# Patient Record
Sex: Female | Born: 1965 | Race: White | Hispanic: No | Marital: Married | Smoking: Current every day smoker
Health system: Southern US, Community
[De-identification: ages and names within clinical notes are randomized; demographics above are authoritative.]

## PROBLEM LIST (undated history)

## (undated) DIAGNOSIS — K219 Gastro-esophageal reflux disease without esophagitis: Secondary | ICD-10-CM

## (undated) DIAGNOSIS — N200 Calculus of kidney: Secondary | ICD-10-CM

## (undated) DIAGNOSIS — K2970 Gastritis, unspecified, without bleeding: Secondary | ICD-10-CM

## (undated) DIAGNOSIS — K5010 Crohn's disease of large intestine without complications: Secondary | ICD-10-CM

## (undated) DIAGNOSIS — M545 Low back pain, unspecified: Secondary | ICD-10-CM

## (undated) DIAGNOSIS — Z800 Family history of malignant neoplasm of digestive organs: Secondary | ICD-10-CM

## (undated) DIAGNOSIS — D649 Anemia, unspecified: Secondary | ICD-10-CM

## (undated) DIAGNOSIS — N83209 Unspecified ovarian cyst, unspecified side: Secondary | ICD-10-CM

## (undated) DIAGNOSIS — F419 Anxiety disorder, unspecified: Secondary | ICD-10-CM

## (undated) DIAGNOSIS — K222 Esophageal obstruction: Secondary | ICD-10-CM

## (undated) DIAGNOSIS — K449 Diaphragmatic hernia without obstruction or gangrene: Secondary | ICD-10-CM

## (undated) DIAGNOSIS — C179 Malignant neoplasm of small intestine, unspecified: Secondary | ICD-10-CM

## (undated) DIAGNOSIS — K56600 Partial intestinal obstruction, unspecified as to cause: Secondary | ICD-10-CM

## (undated) HISTORY — DX: Diaphragmatic hernia without obstruction or gangrene: K44.9

## (undated) HISTORY — DX: Unspecified ovarian cyst, unspecified side: N83.209

## (undated) HISTORY — DX: Esophageal obstruction: K22.2

## (undated) HISTORY — PX: NOVASURE ABLATION: SHX5394

## (undated) HISTORY — DX: Crohn's disease of large intestine without complications: K50.10

## (undated) HISTORY — DX: Malignant neoplasm of small intestine, unspecified: C17.9

## (undated) HISTORY — DX: Family history of malignant neoplasm of digestive organs: Z80.0

## (undated) HISTORY — PX: TUBAL LIGATION: SHX77

## (undated) HISTORY — DX: Anxiety disorder, unspecified: F41.9

## (undated) HISTORY — DX: Gastro-esophageal reflux disease without esophagitis: K21.9

## (undated) HISTORY — DX: Anemia, unspecified: D64.9

## (undated) HISTORY — DX: Low back pain, unspecified: M54.50

## (undated) HISTORY — DX: Partial intestinal obstruction, unspecified as to cause: K56.600

## (undated) HISTORY — DX: Calculus of kidney: N20.0

## (undated) HISTORY — PX: LAPAROSCOPIC SMALL BOWEL RESECTION: SUR793

## (undated) HISTORY — PX: LITHOTRIPSY: SUR834

## (undated) HISTORY — DX: Gastritis, unspecified, without bleeding: K29.70

## (undated) HISTORY — DX: Low back pain: M54.5

---

## 2007-10-12 ENCOUNTER — Ambulatory Visit: Payer: Self-pay | Admitting: Gastroenterology

## 2007-10-19 ENCOUNTER — Ambulatory Visit (HOSPITAL_COMMUNITY): Admission: RE | Admit: 2007-10-19 | Discharge: 2007-10-19 | Payer: Self-pay | Admitting: Gastroenterology

## 2007-11-01 ENCOUNTER — Ambulatory Visit (HOSPITAL_COMMUNITY): Admission: RE | Admit: 2007-11-01 | Discharge: 2007-11-01 | Payer: Self-pay | Admitting: Gastroenterology

## 2007-11-05 ENCOUNTER — Emergency Department (HOSPITAL_COMMUNITY): Admission: EM | Admit: 2007-11-05 | Discharge: 2007-11-05 | Payer: Self-pay | Admitting: Emergency Medicine

## 2007-11-05 ENCOUNTER — Ambulatory Visit: Payer: Self-pay | Admitting: Gastroenterology

## 2007-12-01 DIAGNOSIS — C179 Malignant neoplasm of small intestine, unspecified: Secondary | ICD-10-CM

## 2007-12-01 HISTORY — DX: Malignant neoplasm of small intestine, unspecified: C17.9

## 2007-12-01 HISTORY — PX: HEMICOLECTOMY: SHX854

## 2008-03-17 ENCOUNTER — Encounter (INDEPENDENT_AMBULATORY_CARE_PROVIDER_SITE_OTHER): Payer: Self-pay | Admitting: Interventional Radiology

## 2008-03-17 ENCOUNTER — Ambulatory Visit (HOSPITAL_COMMUNITY): Admission: RE | Admit: 2008-03-17 | Discharge: 2008-03-17 | Payer: Self-pay | Admitting: General Surgery

## 2010-02-06 IMAGING — CT CT ABDOMEN W/ CM
1 of 3 series · 13 of 32 positions shown, 18 images · IV contrast (Omnipaque 300)
Comparison: Prior exams were performed at [facility] [HOSPITAL], not available for comparison.

CT ABDOMEN

CLINICAL DATA: Right lower quadrant pain, history of ileal
stricture and abscess

CT ABDOMEN AND PELVIS WITH CONTRAST
TECHNIQUE: Multidetector CT imaging of the abdomen and pelvis was
performed using the standard protocol following bolus
administration of intravenous contrast.
Contrast: Dilute oral contrast and 100 ml Nmnipaque-7TT

[Series 2: abd_pel 5.0 b40f · axial · 0.54mm/px · z∈[-570,-200]mm · 13 of 84 slices shown, 18 images]
[im 5/84  soft-tissue]
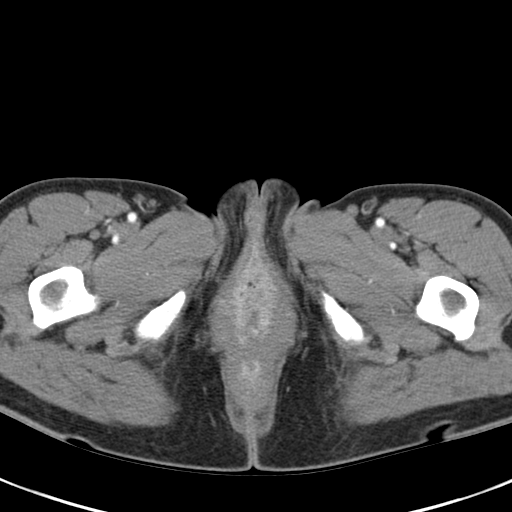
[im 5/84  bone]
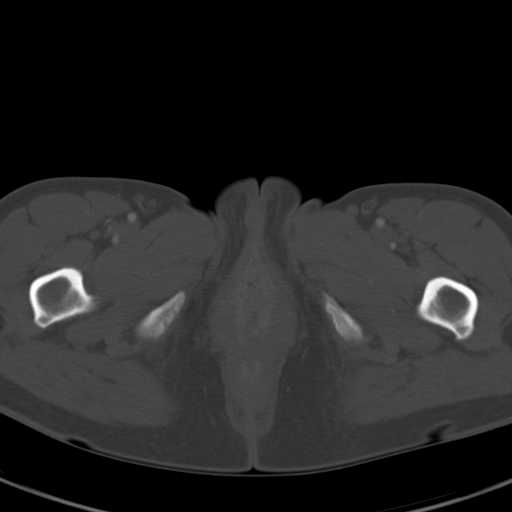
[im 15/84  soft-tissue]
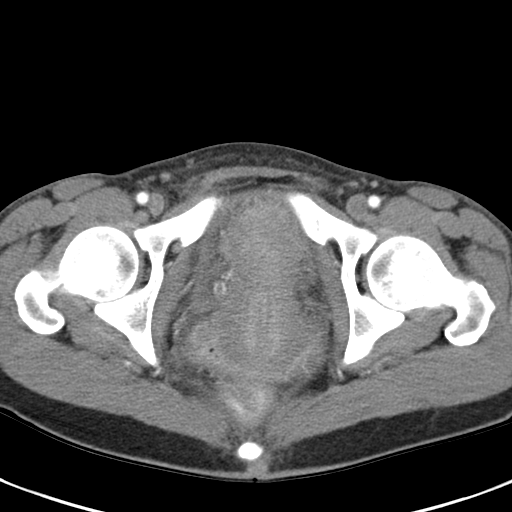
[im 20/84  soft-tissue]
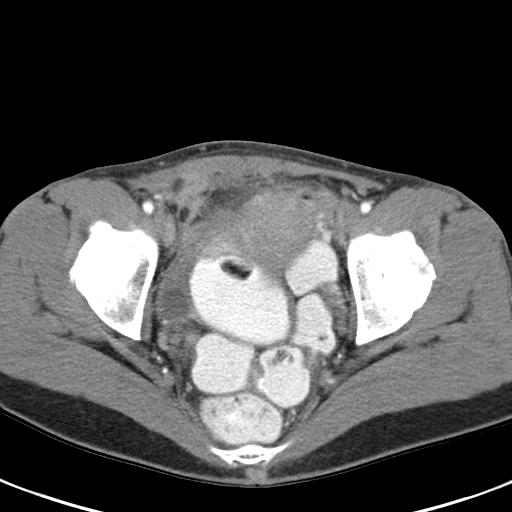
[im 25/84  soft-tissue]
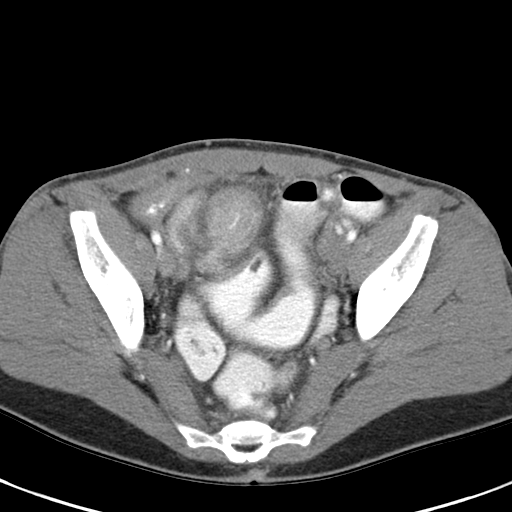
[im 35/84  soft-tissue]
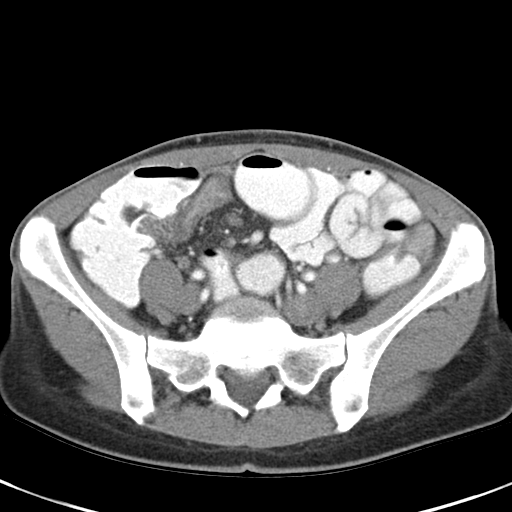
[im 40/84  soft-tissue]
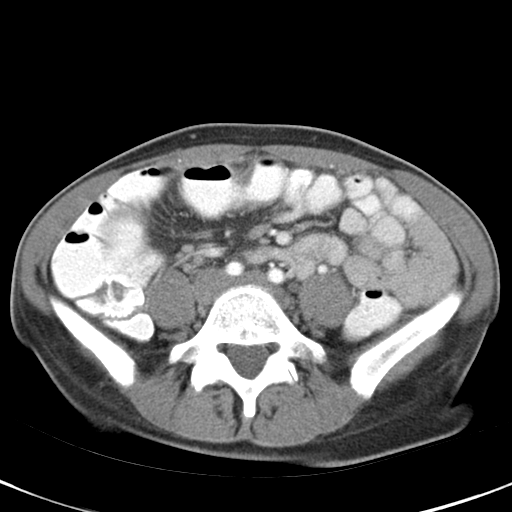
[im 44/84  soft-tissue]
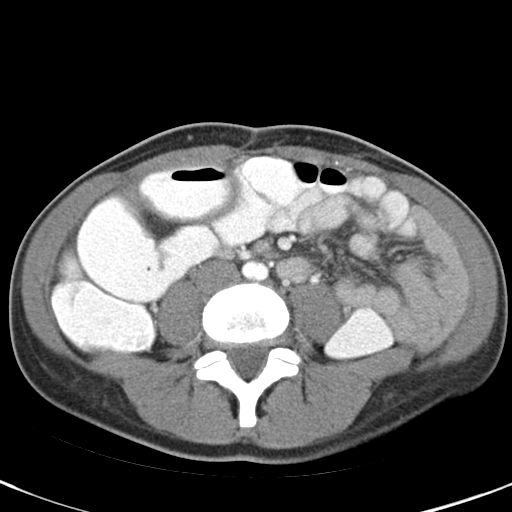
[im 54/84  soft-tissue]
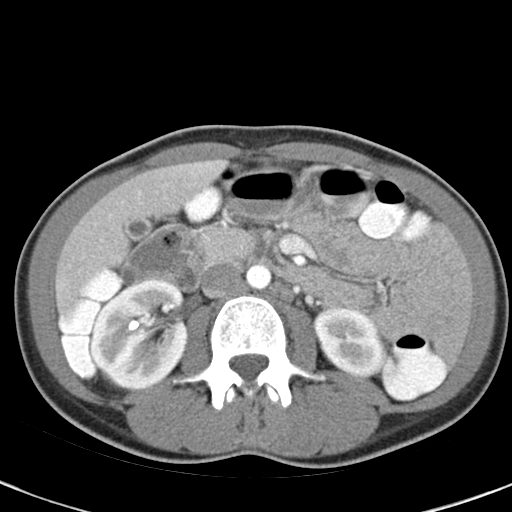
[im 59/84  soft-tissue]
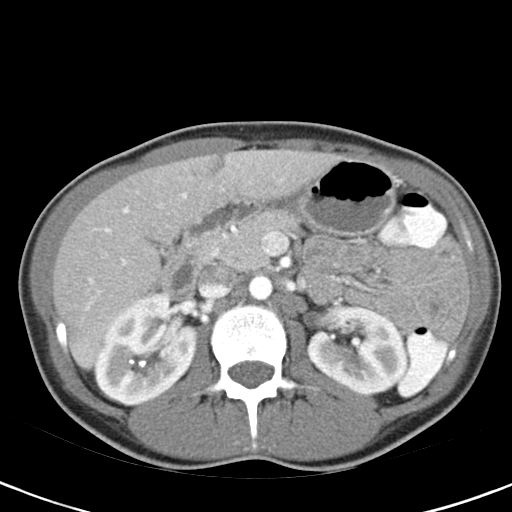
[im 59/84  bone]
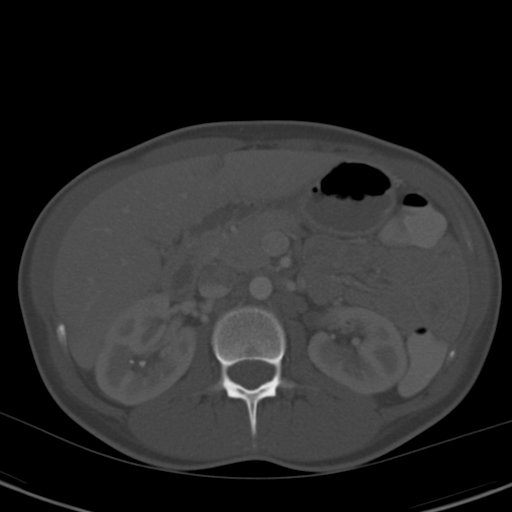
[im 64/84  soft-tissue]
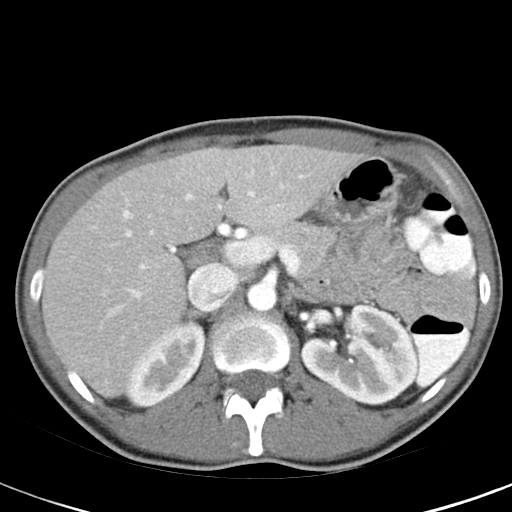
[im 64/84  lung]
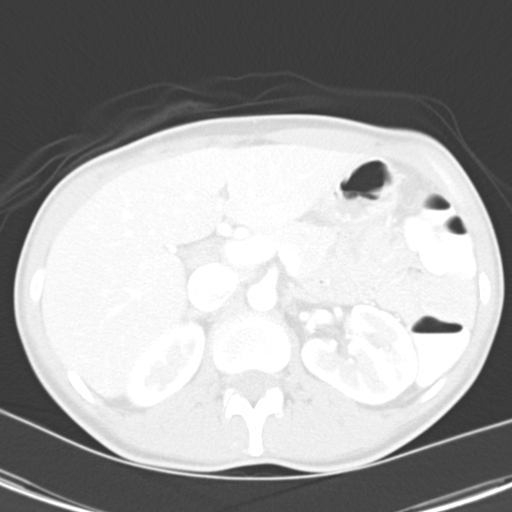
[im 69/84  lung]
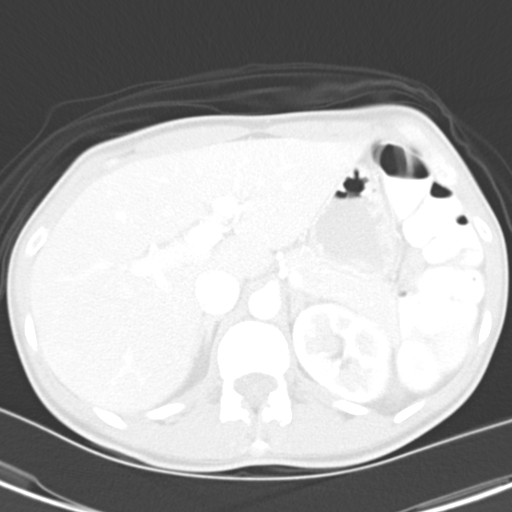
[im 74/84  soft-tissue]
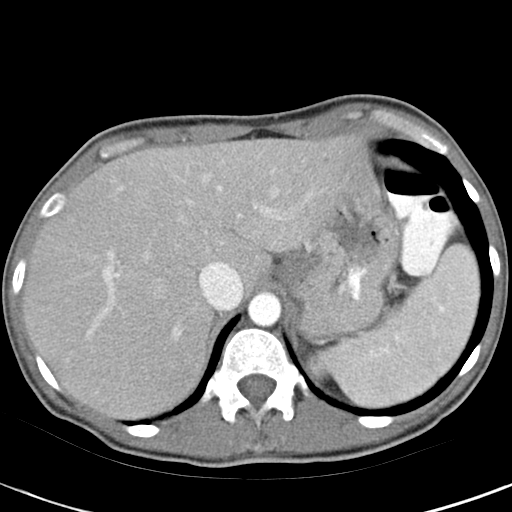
[im 74/84  lung]
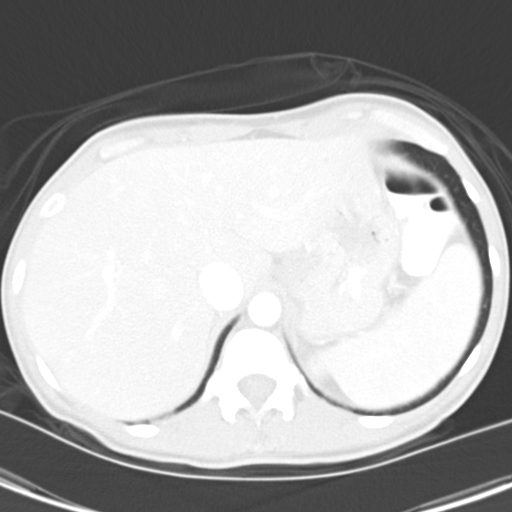
[im 79/84  soft-tissue]
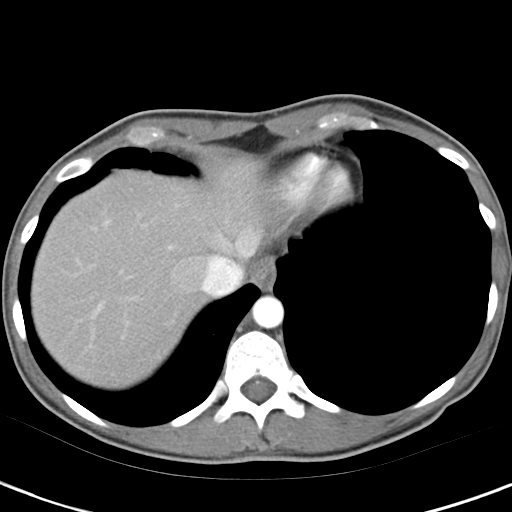
[im 79/84  lung]
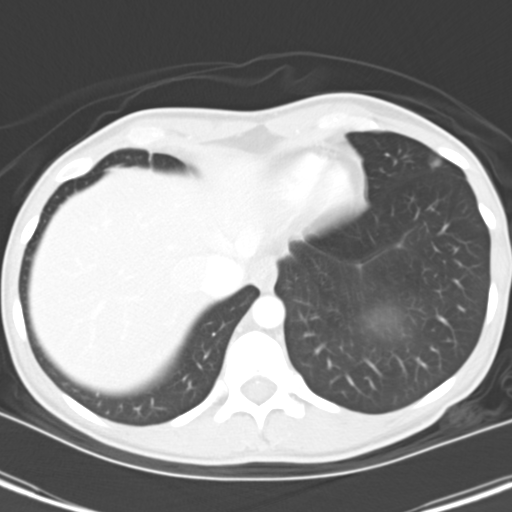

[13 of 32 positions shown; findings below may reference images not displayed]

FINDINGS: Minimal linear atelectasis or scarring, right middle lobe base.
Tiny nonspecific low attenuation foci in liver and kidneys, too
small to characterize.
Bilateral nonobstructing renal calculi.
Liver, spleen, pancreas, kidneys, and adrenal glands otherwise
normal.
No upper abdominal mass, adenopathy, free fluid, or inflammatory
process.
Oral contrast present in descending colon.
IMPRESSION: Bilateral nonobstructing renal calculi.
Tiny nonspecific low attenuation foci in liver and kidneys, too
small to characterize.
No acute upper abdominal abnormalities.

CT PELVIS
FINDINGS: Significant edema and bowel wall thickening of terminal ileum with
evidence of partial obstruction, with proximal small bowel loops
mildly dilated.
Thickened segment of distal ileum extends to ileocecal valve with
associated cecal wall thickening.
Surrounding inflammatory/edematous changes at thickened ileal loop
with mesenteric edema.
Minimal free pelvic fluid.
Small right ovarian cyst, 2.3 x 1.9 cm image 63.
No additional pelvic mass, adenopathy, or hernia.
Adjacent to the terminal ileum and cecum is an inflammatory
collection with central low attenuation suggesting abscess, area
measuring 2.8 x 1.7 cm image 63.
This collection contains a small high attenuation focus, unable to
exclude communication with terminal ileum.
An additional extraluminal tubular collection of slightly increased
attenuation identified near cecal tip, question contrast within a
dilated appendix, wall of this structure slightly thickened though
it is uncertain if this is a primary or secondary change due to
adjacent inflammatory process sees.
A separate normal appearing appendix is not localized.
No acute bony abnormalities.
IMPRESSION: Significant terminal ileitis with extensive bowel wall thickening
and surrounding edema.
Small abscess collection right pelvis [DATE] x 1.7 cm, cannot exclude
communication with GI tract due to central high attenuation.
Cecal and appendiceal wall thickening, uncertain if related to
terminal ileitis, concommitant colitis with a appendiceal wall
thickening, or appendicitis.
Proximal small bowel dilatation compatible with a mild partial
small bowel obstruction.
Correlation with prior outside exams would be of benefit to assess
stability of these findings.

## 2010-05-02 DIAGNOSIS — K5010 Crohn's disease of large intestine without complications: Secondary | ICD-10-CM

## 2010-05-02 HISTORY — DX: Crohn's disease of large intestine without complications: K50.10

## 2010-09-14 NOTE — Consult Note (Signed)
[name], Gill              ACCOUNT NO.:  [id number]   MEDICAL RECORD NO.:  [id number]          PATIENT TYPE:  EMS   LOCATION:  ED                            FACILITY:  [facility]   PHYSICIAN:  Kassie Mends, M.D.      DATE OF BIRTH:  [birth year]   DATE OF CONSULTATION:  11/05/2007  DATE OF DISCHARGE:                                 CONSULTATION   PRIMARY CARE PHYSICIAN:  Dr. Sherril Croon.   REQUESTING PHYSICIAN:  Dr. Toma Deiters.   CONSULTANT:  Dr. Lovell Sheehan, Surgery.   HISTORY OF PRESENT ILLNESS:  Jodi Gill is a 45 year old Caucasian  female with history of ileitis.  She has been seen previously by myself  and Dr. Cira Servant on October 12, 2007.  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  She was started on Cipro 500 mg b.i.d. as well as Flagyl 500  mg t.i.d.  She has continued to complain of left-sided abdominal pain  which radiates from her left lower quadrant to her upper abdomen.  She  complains of nausea but denies any vomiting.  She has had some dry  heaves.  She has had some low grade fevers at home.  She had been taking  Percocet 5/325 mg p.r.n. for pain but usually once daily.  She tells me  she needs something stronger for her pain control.  She had a CAT scan  on November 01, 2007, which showed dilated small-bowel loops, partial small-  bowel obstruction, persistent inflammatory process in the anterior right  pelvis with thickened TI, thickened appendix with hypervascularity and  small abscess collection which has slightly decreased in size about  approximately 50% since last CT October 19, 2007.  She is having 1 to 2  foamy loose stools per day.   PAST MEDICAL AND SURGICAL HISTORY:  Ileitis as described in HPI with  abscess collection as described in HPI.  She has a history of stricture  and partial small-bowel obstruction.  She has a history of anemia,  tubal  ligation and renal lithiasis.   CURRENT MEDICATIONS.:  1. Percocet 5/325 mg t.i.d. p.r.n.  2. Cipro 500 mg b.i.d.  3. Flagyl 500 mg t.i.d.   ALLERGIES:  TO MORPHINE.   FAMILY HISTORY:  Is positive for father diagnosed with colon cancer at  34.  Mother with history of anemia, hypertension, anxiety.  One brother  passed away at 25 due to MI, coronary artery disease.   SOCIAL HISTORY:  Jodi Gill is married.  She has one son who is grown  and healthy.  She is a spinner at a mill.  She has a 20-pack-year  history of tobacco use.  Previously consumed 6 to 8 beers per week but  has not been drinking recently.  She denies any drug use.   REVIEW OF SYSTEMS:  See HPI, otherwise negative.   PHYSICAL EXAMINATION:  VITAL SIGNS:  Temperature 97.3, blood pressure  110/68, pulse 91, respirations 20, pulse oximetry 94% on  room air.  GENERAL:  Jodi Gill is a thin-appearing, well-developed, well-  nourished, Caucasian female in no acute distress.  HEENT:  Sclerae are  clear and nonicteric.  Conjunctivae pink.  Oropharynx pink and moist  without lesions.  NECK:  Supple without mass or thyromegaly.  CHEST:  Heart regular rate and rhythm, normal S1/S2, without murmurs, rubs,  clicks or gallops.  LUNGS:  Clear to auscultation bilaterally.  ABDOMEN:  Positive bowel sounds x4.  No bruits auscultated.  Soft, nontender,  mildly distended.  She had does have mild tenderness to left lower  quadrant and around the umbilicus as well as right lower quadrant and  epigastrium on deep palpation.  There is no rebound tenderness or  guarding.  No hepatosplenomegaly or masses.  EXTREMITIES:  Without  clubbing or edema bilaterally.   LABORATORY STUDIES:  Are pending.   IMPRESSION:  Jodi Gill is a 45 year old Caucasian female with a history  of ileitis suspected be due to inflammatory bowel disease with partial  small-bowel obstruction and 50% resolution of previous abscess  collection in the right  anterior pelvis.  Her main concerns today is  pain control.  She has been using p.r.n. Percocet, and she is going to  need more adequate pain control and dietary supplementation.  She does  not make criteria for admission at this point.  She has been seen by Dr.  Lovell Sheehan surgery who did not recommend surgery at this point, given the  fact that she has partial small-bowel obstruction only.   PLAN:  1. Will add OxyContin 10 mg 1 p.o. b.i.d. as well as refill Percocet      5/325 mg 1 to 2 q.4-6h. p.r.n. for breakthrough pain #30 with no      refills.  She was given #60 OxyContin with no refills.  2. She has refills of Cipro 500 mg b.i.d. as well as Flagyl 500 mg      t.i.d. already which she will get from the pharmacy.  3. Low-residue diet with high calories  4. Repeat CT scan with oral contrast in 2 weeks.  5. She is going to call us if she has any further problems.  6. She was given a work note to keep her out from November 19, 2007.  7. Will follow up on lab work.  8. She will be given a dose of Zofran 4 mg IV now as well as Dilaudid      2 mg IV now and will be discharged home.  9. She has our number and will call if she has any further      complications.      Lorenza Burton, N.P.      Kassie Mends, M.D.  Electronically Signed    KJ/MEDQ  D:  11/05/2007  T:  11/05/2007  Job:  [id number]   cc:   Doreen Beam, MD  Fax: [phone]   Lovell Sheehan, M.D.  [place], [place]

## 2010-09-14 NOTE — Consult Note (Signed)
Jodi Gill, Jodi Gill              ACCOUNT NO.:  [id number]   MEDICAL RECORD NO.:  [id number]          PATIENT TYPE:  EMS   LOCATION:  ED                            FACILITY:  [facility]   PHYSICIAN:  Dalia Heading, M.D.  DATE OF BIRTH:  [birth year]   DATE OF CONSULTATION:  DATE OF DISCHARGE:  11/05/2007                                 CONSULTATION   REASON FOR CONSULTATION:  Terminal ileitis.   HISTORY OF PRESENT ILLNESS:  The patient is a 45 year old white female  who has had a long-standing history of intermittent right lower quadrant  abdominal pain secondary to an inflamed terminal ileum with a small  intra-abdominal abscess.  She has been to both [facility] and [facility].  She has had multiple CAT scans  showing an area of inflammation with microperforation in the right lower  quadrant region, the latest CT scan was done on November 01, 2007.  This  showed a smaller abscess, 70 mm in its greatest diameter, along with  terminal ileum inflammation.  She does have a partial small bowel  obstruction by CT scan.  She presented today due to worsening right  lower quadrant abdominal pain which Percocet did not help.  She denies  any fever or chills.  She denies any nausea or vomiting.  Her last bowel  movement was earlier today and was mucousy in nature.  She has not had a  colonoscopy due to recurrent flare-ups of this condition.   PAST MEDICAL HISTORY:  As noted above.   PAST SURGICAL HISTORY:  Unremarkable.   CURRENT MEDICATIONS:  As listed on the chart.   ALLERGIES:  MORPHINE.   REVIEW OF SYSTEMS:  Noncontributory.   On physical examination, the patient is a well-developed, well-nourished  white female in no acute distress.  She is afebrile.  Vital signs are  stable.  Her abdomen is soft and flat.  She does have tenderness of the  right lower quadrant region to palpation.  No hepatosplenomegaly,  masses, hernias, and rigidity are noted.   Laboratory  tests are still pending.  CT scan from November 01, 2007, was  reviewed.   IMPRESSION:  Resolving enteritis with resolving contained intra-  abdominal abscess, etiology unknown.   PLAN:  I will discuss with Dr. Cira Servant of Gastroenterology, who's office  has seen the patient in the past.  I do not feel she needs an acute  surgical intervention at this time.  I will follow the patient closely  with you.  This has been explained to the patient.  Due to the  possibility of Crohn disease, I would like to delay any surgical  intervention at all possible.  The patient understands this.      Dalia Heading, M.D.  Electronically Signed     MAJ/MEDQ  D:  11/05/2007  T:  11/06/2007  Job:  [id number]   cc:   Doreen Beam, MD  Fax: [phone]

## 2010-09-14 NOTE — Consult Note (Signed)
Jodi Gill, Jodi Gill              ACCOUNT NO.:  [id number]   MEDICAL RECORD NO.:  [id number]          PATIENT TYPE:  AMB   LOCATION:  DAY                           FACILITY:  [facility]   PHYSICIAN:  Kassie Mends, M.D.      DATE OF BIRTH:  1965-05-10   DATE OF CONSULTATION:  DATE OF DISCHARGE:                                 CONSULTATION   REQUESTING PHYSICIAN:  Dr. Sherril Croon.   REASON FOR CONSULTATION:  Transaminitis/history of ileitis.   HISTORY OF PRESENT ILLNESS:  Jodi Gill is a 45 year old Caucasian  female.  She has been referred for history of transaminitis.  She was  noted to have an elevated AST of 47 and ALT of 94 and GGT of 85 on August 16, 2007.  She had negative hepatitis A antibody, B surface antigen, and  C, and hepatitis B core antibody.  She notes prior to this issue she was  hospitalized in January as well as in May with acute ileitis.  Hospital  records have been reviewed and basically she tells me that she developed  severe abdominal pain mostly in the right lower quadrant along with some  nausea, vomiting, and low-grade fever.  She was having firmy bowel  movements.  She has noticed intermittent dark blood in her stool as  well.  She complains of right-sided stabbing abdominal pain that usually  lasts a few seconds, but persists for several days.  She had a CT scan  of the abdomen and pelvis without contrast on May 26, 2007.  Exam  was limited given lack of IV and oral contrast.  She was found to have  diffuse ileal wall thickening causing partial obstruction, bilateral  nephrolithiasis, and exam was repeated on May 28, 2007.  She was  found to have diffuse ileal thickening in the lower abdomen and upper  pelvis with evidence of partial SBO.  She had nonobstructing  nephrolithiasis and a 2.8-cm cyst within the right ovary.  She had a CT  enterography during second hospital admission on Sep 20, 2007.  She was  found to have markedly abnormal terminal 10 cm of the  ileum.  There is  no inner wall and areas of the transmural enhancement with an abrupt  tapering of the relatively normal-appearing but dilated ileum just  proximal to the inflamed segment.  Imaging features consistent with an  inflammatory stricture of the TI.  She has multiple microabscesses in  the phlegmonous change associated with the terminal ileum and a discrete  3.6 x 2.9 cm extraluminal abscess is seen in the anterior right lower  quadrant.  LFTs on Sep 20, 2007, show an alkaline phosphatase of 97, AST  of 29, and ALT of 48.  Total bilirubin is normal.  She had a low iron of  30, transferrin of 132, a TIBC of 195, and hemoglobin of 9.2 on Sep 23, 2007 with a hematocrit of 28 and an MCV of 86.6.  She had a white blood  cell count of 13.9 on Sep 20, 2007, and a platelet count of 677.  She  has been  treated with two courses of antibiotics for approximately 10  days a piece.  She notes that her nausea is much better.  She continues  to complain of daily indigestion and every time she eats she has  increased belching.  She has lost 13 pounds in the last 6 months.  She  had previously been on NSAID therapy, but is not using anything  currently.  She was previously on iron pills, but has not been taking  them recently.  She has cut out all of her caffeine and alcohol  consumption.  She does have three tattoos and has never received a blood  transfusion.  She did have an abdominal ultrasound August 24, 2007.  There is no evidence of gallstones or biliary ductal dilatation.  A 1-cm  hypoechoic lesion in the lateral segment of the left hepatic lobe  consistent with small hemangioma, recommend MRI or follow up with  ultrasound in 6 months to confirm.   PAST MEDICAL AND SURGICAL HISTORY:  Hospitalizations for acute ileitis  with possible stricturing, see HPI.  She has a history of anemia, tubal  ligation, and renal lithiasis.   CURRENT MEDICATIONS:  1. Multivitamin daily.  2. Iron pills  daily.   ALLERGIES:  MORPHINE causes a rash.   FAMILY HISTORY:  Positive for her father diagnosed with colon cancer at  age 75.  He passed away at age 39.  Mother has history of anemia,  hypertension, and anxiety.  One brother passed away at 55 due to MI.   SOCIAL HISTORY:  Jodi Gill is married.  She has one son who is grown  and healthy.  She is a spinner in a mill.  She has a 20-pack-year  history of tobacco use.  She previously consumed 6-8 beers per week, but  has not had anything to drink per report in the last 3 months.  She  denies any drug use.   REVIEW OF SYSTEMS:  See HPI, otherwise negative.   PHYSICAL EXAMINATION:  VITAL SIGNS:  Weight 106 pounds, height 62  inches, temperature 97.9, blood pressure 100/76, and pulse 88.  GENERAL:  Jodi Gill is a well-developed, well-nourished, thin Caucasian  female in no acute distress.  She is quite anxious today.  HEENT:  Sclerae clear and nonicteric.  Conjunctiva pink.  Oropharynx  pink and moist without any lesions.  NECK:  Supple without mass or thyromegaly.  HEART:  Regular rate and rhythm.  Normal S1 and S2 without murmurs,  clicks, rubs, or gallops.  ABDOMEN:  She does have an umbilical ornament.  Abdomen has positive  bowel sounds x4, soft, nontender, or nondistended.  She does have  moderate tenderness to the right mid abdomen just 1-2 cm adjacent to the  umbilicus.  There is no rebound tenderness or guarding.  No  hepatosplenomegaly or mass.  EXTREMITIES:  Without clubbing or edema bilaterally.   IMPRESSION:  Jodi Gill is a 45 year old female with a 16-month history  of persistent ileitis and possible small bowel stricture.  She has been  treated with two courses of antibiotics through [facility].  She is sent for further evaluation of transaminitis.  Her  transaminases have decreased significantly, but are not yet normal.   So basically as far as her ileitis is concerned, she is going to need  further  evaluation to rule out Crohn disease including biopsies and  given her history of indigestion, nausea and vomiting, and weight loss,  she will undergo EGD with  possible small bowel biopsy as well as  colonoscopy with terminal ileoscopy at the same time and possible biopsy  by Dr. Cira Servant.   As far as her transaminitis is concerned, liver appears normal with a 1-  cm possible hemangioma on recent ultrasound.  Nothing to explain her  transaminitis, although she has been on antibiotics which could cause a  transient transaminitis.  She was previously consuming daily alcohol  which could also be causing a mild transaminitis as well.  We do need to  rule out hepatitis C as well.   PLAN:  1. We would recheck LFTs as well as sed rate, CBC, hepatitis C      antibody, and MET-7 and we will draw two tubes of serum to freeze      for further testing if she has persistent moderate transaminitis.  2. EGD and colonoscopy with terminal ileoscopy by Dr. Cira Servant in the      near future.  We discussed both procedures including risks and      benefits to include, but not limited to bleeding, infection,      perforation, or drug reaction.  She agrees with plan and consent be      obtained.  3. Pending EGD and colonoscopy findings, we would consider either an      MRI of for further evaluation of hepatic lesion versus followup      ultrasound per Dr. Cira Servant.  4. She declines anything for pain at this time.   Thank you Dr. Sherril Croon for allowing [place] to participate in care of Jodi Gill.   ADDENDUM 7209:  CT Scan showed persistent inflammation in TI/Cecum,  likley IBD with abscess improved since May 2009. Pt continued on Abx.  Surgery consulted if abscess does not resolve with Abx then consider  drainage and/or surgery.      Lorenza Burton, N.P.      Kassie Mends, M.D.  Electronically Signed    KJ/MEDQ  D:  10/12/2007  T:  10/13/2007  Job:  [id number]   cc:   Doreen Beam, MD  Fax: [phone]

## 2011-01-24 ENCOUNTER — Encounter: Payer: Self-pay | Admitting: Gastroenterology

## 2011-01-27 LAB — DIFFERENTIAL
Basophils Absolute: 0.1
Basophils Relative: 1
Lymphocytes Relative: 15
Lymphs Abs: 1.4
Monocytes Relative: 4

## 2011-01-27 LAB — COMPREHENSIVE METABOLIC PANEL
AST: 18
Albumin: 3.3 — ABNORMAL LOW
Alkaline Phosphatase: 46
Chloride: 102
Total Bilirubin: 0.5

## 2011-01-27 LAB — CBC
HCT: 41.6
Hemoglobin: 13.7
MCHC: 32.9
Platelets: 445 — ABNORMAL HIGH
RBC: 4.87
RDW: 17.6 — ABNORMAL HIGH
WBC: 10

## 2011-01-27 LAB — URINALYSIS, ROUTINE W REFLEX MICROSCOPIC
Glucose, UA: NEGATIVE
Urobilinogen, UA: 0.2
pH: 6.5

## 2011-01-27 LAB — APTT: aPTT: 31

## 2011-01-27 LAB — PROTIME-INR: INR: 1

## 2011-02-01 LAB — ANAEROBIC CULTURE

## 2011-02-01 LAB — CBC
HCT: 38.8
Hemoglobin: 12.8
MCHC: 33
MCV: 85.7
RBC: 4.52
RDW: 17.6 — ABNORMAL HIGH
WBC: 8.4

## 2011-02-01 LAB — PROTIME-INR
INR: 1
Prothrombin Time: 13.9

## 2011-02-01 LAB — CULTURE, ROUTINE-ABSCESS

## 2011-02-01 LAB — APTT: aPTT: 30

## 2011-02-11 ENCOUNTER — Ambulatory Visit (INDEPENDENT_AMBULATORY_CARE_PROVIDER_SITE_OTHER): Payer: BC Managed Care – PPO | Admitting: Gastroenterology

## 2011-02-11 ENCOUNTER — Encounter: Payer: Self-pay | Admitting: Gastroenterology

## 2011-02-11 VITALS — BP 110/68 | HR 80 | Ht 62.0 in | Wt 106.8 lb

## 2011-02-11 DIAGNOSIS — F411 Generalized anxiety disorder: Secondary | ICD-10-CM

## 2011-02-11 DIAGNOSIS — K5080 Crohn's disease of both small and large intestine without complications: Secondary | ICD-10-CM

## 2011-02-11 DIAGNOSIS — F419 Anxiety disorder, unspecified: Secondary | ICD-10-CM

## 2011-02-11 DIAGNOSIS — K50811 Crohn's disease of both small and large intestine with rectal bleeding: Secondary | ICD-10-CM

## 2011-02-11 DIAGNOSIS — C172 Malignant neoplasm of ileum: Secondary | ICD-10-CM

## 2011-02-11 DIAGNOSIS — K625 Hemorrhage of anus and rectum: Secondary | ICD-10-CM

## 2011-02-11 MED ORDER — PEG-KCL-NACL-NASULF-NA ASC-C 100 G PO SOLR: 1.0000 | ORAL | Status: DC

## 2011-02-11 NOTE — Progress Notes (Signed)
History of Present Illness:  This is a very nice but very complex 45 year old Caucasian female. On physical exam she was recently found to have guaiac positive stool. The patient currently denies any GI symptoms such as melena, hematochezia, abdominal pain, upper GI or hepatobiliary complaints. She has regular daily bowel movements, follows a regular diet, has no food intolerances.  Her past medical history is very complex and involved around suspected inflammatory bowel disease in 2009 with associated ileitis. She was on immunosuppressive medication and apparently underwent a resection which showed evidence of adenocarcinoma. She subsequently developed an enteric cutaneous fistula, abscess, and had several surgical procedures. Currently she is asymptomatic and denies abdominal pain or any symptoms of IBD. Has not had colonoscopy followup since 2010. There are extensive records that I reviewed today from [place] and from [facility]. Recent blood work in September showed a normal CBC a metabolic profile and thyroid function tests.  I have reviewed this patient's present history, medical and surgical past history, allergies and medications.     ROS: The remainder of the 10 point ROS is negative... she does have allergic sinusitis, resume her menstrual cramps, and does complain of night sweats. She also positive for kidney stones and previous tubal ligation.  Past Medical History  Diagnosis Date  . Anxiety   . 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 Kidney stones   . Ovarian cyst   . Low back pain   . Bowel obstruction    Past Surgical History  Procedure Date  . Hemicolectomy 8-09    Rt  . Laparoscopic small bowel resection     reports that she has been smoking Cigarettes.  She has a 10 pack-year smoking history. She has never used smokeless tobacco. She reports that she drinks alcohol. She reports that she does not use illicit drugs. family history  includes Colon cancer in her paternal uncle; Colon cancer (age of onset:78) in her father; Hypertension in her mother; and Stomach cancer in her father. Allergies  Allergen Reactions  . Peanut Oil     Throat closes        Physical Exam: General well developed well nourished patient in no acute distress, appearing her stated age Eyes PERRLA, no icterus, fundoscopic exam per opthamologist Skin no lesions noted Neck supple, no adenopathy, no thyroid enlargement, no tenderness Chest clear to percussion and auscultation Heart no significant murmurs, gallops or rubs noted Abdomen no hepatosplenomegaly masses or tenderness, BS normal.  Extremities no acute joint lesions, edema, phlebitis or evidence of cellulitis. Neurologic patient oriented x 3, cranial nerves intact, no focal neurologic deficits noted. Psychological mental status normal and normal affect.  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 Her guaiac positive stool is probably related to this process. However, as mentioned above, she is completely asymptomatic. She is on B12 replacement therapy. Also the patient has chronic anxiety syndrome and exam next on when necessary basis. I scheduled her for colonoscopy and endoscopy with propofol sedation. I suspect the patient will also need followup CT scan of the abdomen and pelvis.  Please copy her primary care physician, referring physician, and pertinent subspecialists.   Encounter Diagnosis  Name Primary?  . Rectal bleeding Yes

## 2011-02-11 NOTE — Patient Instructions (Signed)
You have been scheduled for an Endoscopy and a Colonoscopy, instructions have been provided. Your prep has been sent to your pharmacy. 

## 2011-02-28 ENCOUNTER — Ambulatory Visit (AMBULATORY_SURGERY_CENTER): Payer: BC Managed Care – PPO | Admitting: Gastroenterology

## 2011-02-28 ENCOUNTER — Encounter: Payer: Self-pay | Admitting: Gastroenterology

## 2011-02-28 ENCOUNTER — Telehealth: Payer: Self-pay | Admitting: *Deleted

## 2011-02-28 VITALS — BP 107/66 | HR 83 | Temp 98.2°F | Resp 20 | Ht 62.0 in | Wt 106.0 lb

## 2011-02-28 DIAGNOSIS — K219 Gastro-esophageal reflux disease without esophagitis: Secondary | ICD-10-CM

## 2011-02-28 DIAGNOSIS — Z8719 Personal history of other diseases of the digestive system: Secondary | ICD-10-CM

## 2011-02-28 DIAGNOSIS — K625 Hemorrhage of anus and rectum: Secondary | ICD-10-CM

## 2011-02-28 DIAGNOSIS — K222 Esophageal obstruction: Secondary | ICD-10-CM

## 2011-02-28 DIAGNOSIS — K2970 Gastritis, unspecified, without bleeding: Secondary | ICD-10-CM

## 2011-02-28 DIAGNOSIS — K5289 Other specified noninfective gastroenteritis and colitis: Secondary | ICD-10-CM

## 2011-02-28 DIAGNOSIS — R195 Other fecal abnormalities: Secondary | ICD-10-CM

## 2011-02-28 DIAGNOSIS — K2990 Gastroduodenitis, unspecified, without bleeding: Secondary | ICD-10-CM

## 2011-02-28 DIAGNOSIS — C172 Malignant neoplasm of ileum: Secondary | ICD-10-CM

## 2011-02-28 DIAGNOSIS — K2950 Unspecified chronic gastritis without bleeding: Secondary | ICD-10-CM

## 2011-02-28 DIAGNOSIS — K2940 Chronic atrophic gastritis without bleeding: Secondary | ICD-10-CM

## 2011-02-28 DIAGNOSIS — K5010 Crohn's disease of large intestine without complications: Secondary | ICD-10-CM

## 2011-02-28 DIAGNOSIS — K50813 Crohn's disease of both small and large intestine with fistula: Secondary | ICD-10-CM

## 2011-02-28 MED ORDER — SODIUM CHLORIDE 0.9 % IV SOLN: 500.0000 mL | INTRAVENOUS | Status: DC

## 2011-02-28 MED ORDER — ESOMEPRAZOLE MAGNESIUM 40 MG PO CPDR: DELAYED_RELEASE_CAPSULE | ORAL | Status: AC

## 2011-02-28 NOTE — Patient Instructions (Addendum)
Please refer to your neon green sheet for instructions regarding activity for the rest of today. You may resume your medications as you would normally take them. Follow Up appointment in 2 weeks. CT Scan scheduled  Please follow the ESOPHAGEAL DILATION DIET for today as follows:  -Nothing by mouth for 1 hour 9:20AM-10:20AM  -Clear Liquids for 1 hour 10:20AM-11:20AM  -Soft foods for the rest of today 11:20AM until tomorrow  Colitis Colitis is inflammation of the colon. Colitis can be a short-term or long-standing (chronic) illness. Crohn's disease and ulcerative colitis are 2 types of colitis which are chronic. They usually require lifelong treatment. CAUSES  There are many different causes of colitis, including:  Viruses.   Germs (bacteria).   Medicine reactions.  SYMPTOMS   Diarrhea.   Intestinal bleeding.   Pain.   Fever.   Throwing up (vomiting).   Tiredness (fatigue).   Weight loss.   Bowel blockage.  DIAGNOSIS  The diagnosis of colitis is based on examination and stool or blood tests. X-rays, CT scan, and colonoscopy may also be needed. TREATMENT  Treatment may include:  Fluids given through the vein (intravenously).   Bowel rest (nothing to eat or drink for a period of time).   Medicine for pain and diarrhea.   Medicines (antibiotics) that kill germs.   Cortisone medicines.   Surgery.  HOME CARE INSTRUCTIONS   Get plenty of rest.   Drink enough water and fluids to keep your urine clear or pale yellow.   Eat a well-balanced diet.   Call your caregiver for follow-up as recommended.  SEEK IMMEDIATE MEDICAL CARE IF:   You develop chills.   You have an oral temperature above 102 F (38.9 C), not controlled by medicine.   You have extreme weakness, fainting, or dehydration.   You have repeated vomiting.   You develop severe belly (abdominal) pain or are passing bloody or tarry stools.  MAKE SURE YOU:   Understand these instructions.   Will  watch your condition.   Will get help right away if you are not doing well or get worse.  Document Released: 05/26/2004 Document Revised: 12/29/2010 Document Reviewed: 08/21/2009 [facility] Patient Information 2012 [place], [place]. Esophageal Stricture The esophagus is the long, narrow tube which carries food and liquid from the mouth to the stomach. Sometimes a part of the esophagus becomes narrow and makes it difficult, painful, or even impossible to swallow. This is called an esophageal stricture.  CAUSES  Common causes of blockage or strictures of the esophagus are:  Exposure of the lower esophagus to the acid from the stomach may cause narrowing.   Hiatal hernia in which a small part of the stomach bulges up through the diaphragm can cause a narrowing in the bottom of the esophagus.   Scleroderma is a tissue disorder that affects the esophagus and makes swallowing difficult.   Achalasia is an absence of nerves in the lower esophagus and to the esophageal sphincter. This absence of nerves may be congenital (present since birth). This can cause irregular spasms which do not allow food and fluid through.   Strictures may develop from swallowing materials which damage the esophagus. Examples are acids or alkalis such as lye.   Schatzki's Ring is a narrow ring of non-cancerous tissue which narrows the lower esophagus. The cause of this is unknown.   Growths can block the esophagus.  SYMPTOMS  Some of the problems are difficulty swallowing or pain with swallowing. DIAGNOSIS  Your caregiver often suspects  this problem by taking a medical history. They will also do a physical exam. They may then take X-rays and/or perform an endoscopy. Endoscopy is an exam in which a tube like a small flexible telescope is used to look at your esophagus.  TREATMENT  One form of treatment is to dilate the narrow area. This means to stretch it.   When this is not successful, chest surgery may be required.  This is a much more extensive form of treatment with a longer recovery time.  Both of the above treatments make the passage of food and water into the stomach easier. They also make it easier for stomach contents to bubble back into the esophagus. Special medications may be used following the procedure to help prevent further narrowing. Medications may be used to lower the amount of acid in the stomach juice.  SEEK IMMEDIATE MEDICAL CARE IF:   Your swallowing is becoming more painful, difficult, or you are unable to swallow.   You vomit up blood.   You develop black tarry stools.   You develop chills.   You have a fever.   You develop chest or abdominal pain.   You develop shortness of breath, feel lightheaded, or faint.  Follow up with medical care as your caregiver suggests. Document Released: 12/27/2005 Document Revised: 12/29/2010 Document Reviewed: 02/02/2006 [facility] Patient Information 2012 [place], [place].

## 2011-02-28 NOTE — Telephone Encounter (Signed)
Pt given her schedule for CT on 03/02/11 at 0900. She states she's allergic to the oral contrast no matter what flavor, so she was given the number to CT and informed she would have to be there 2 hours early to drink a water based contrast; stated understanding. She was also given a f/u appt with Dr Jarold Motto on 03/22/11 and informed her I ordered Nexium at [facility] in [place]; pt stated understanding.

## 2011-03-01 ENCOUNTER — Telehealth: Payer: Self-pay | Admitting: *Deleted

## 2011-03-01 ENCOUNTER — Encounter: Payer: Self-pay | Admitting: Gastroenterology

## 2011-03-01 LAB — HELICOBACTER PYLORI SCREEN-BIOPSY: UREASE: NEGATIVE

## 2011-03-01 NOTE — Telephone Encounter (Signed)
No answer

## 2011-03-02 ENCOUNTER — Telehealth: Payer: Self-pay | Admitting: *Deleted

## 2011-03-02 ENCOUNTER — Ambulatory Visit (INDEPENDENT_AMBULATORY_CARE_PROVIDER_SITE_OTHER)
Admission: RE | Admit: 2011-03-02 | Discharge: 2011-03-02 | Disposition: A | Discharge status: Home / Self Care | Payer: BC Managed Care – PPO | Source: Ambulatory Visit | Attending: Gastroenterology | Admitting: Gastroenterology

## 2011-03-02 DIAGNOSIS — Z8719 Personal history of other diseases of the digestive system: Secondary | ICD-10-CM

## 2011-03-02 DIAGNOSIS — K5090 Crohn's disease, unspecified, without complications: Secondary | ICD-10-CM

## 2011-03-02 DIAGNOSIS — K625 Hemorrhage of anus and rectum: Secondary | ICD-10-CM

## 2011-03-02 MED ORDER — IOHEXOL 300 MG/ML  SOLN
100.0000 mL | Freq: Once | INTRAMUSCULAR | Status: AC | PRN
  Administered 2011-03-02: 100 mL via INTRAVENOUS

## 2011-03-02 MED ORDER — BUDESONIDE 3 MG PO CP24: ORAL_CAPSULE | ORAL | Status: DC

## 2011-03-02 NOTE — Telephone Encounter (Signed)
Informed pt she needs to come in for labs- she will come tomorrow- and I need to order Entocort for her active Crohn's; pt stated understanding.

## 2011-03-02 NOTE — Telephone Encounter (Signed)
Message copied by Florene Glen on Wed Mar 02, 2011 12:56 PM ------      Message from: Jarold Motto, DAVID R      Created: Wed Mar 02, 2011 12:38 PM       These have her come in and do CRP and sed rate. Then start Entocort 9 mg a day with followup in one month's time. It appears that she has rather active Crohn's disease at her anastomosis.

## 2011-03-03 ENCOUNTER — Telehealth: Payer: Self-pay | Admitting: *Deleted

## 2011-03-03 ENCOUNTER — Other Ambulatory Visit: Payer: Self-pay | Admitting: Gastroenterology

## 2011-03-03 NOTE — Telephone Encounter (Signed)
Pt requested to have labs drawn at [facility]. Faxed order for CRP and ESR to [phone], ofc [phone]. Pt stated understanding.

## 2011-03-04 ENCOUNTER — Encounter: Payer: Self-pay | Admitting: Gastroenterology

## 2011-03-04 LAB — C-REACTIVE PROTEIN: CRP: 0.63 mg/dL — ABNORMAL HIGH

## 2011-03-16 ENCOUNTER — Encounter: Payer: Self-pay | Admitting: *Deleted

## 2011-03-22 ENCOUNTER — Ambulatory Visit: Payer: BC Managed Care – PPO | Admitting: Gastroenterology

## 2011-03-28 ENCOUNTER — Encounter: Payer: Self-pay | Admitting: *Deleted

## 2011-03-31 ENCOUNTER — Ambulatory Visit (INDEPENDENT_AMBULATORY_CARE_PROVIDER_SITE_OTHER): Payer: BC Managed Care – PPO | Admitting: Gastroenterology

## 2011-03-31 ENCOUNTER — Encounter: Payer: Self-pay | Admitting: Gastroenterology

## 2011-03-31 VITALS — BP 90/50 | HR 80 | Ht 62.0 in | Wt 105.5 lb

## 2011-03-31 DIAGNOSIS — C179 Malignant neoplasm of small intestine, unspecified: Secondary | ICD-10-CM

## 2011-03-31 DIAGNOSIS — F17200 Nicotine dependence, unspecified, uncomplicated: Secondary | ICD-10-CM | POA: Insufficient documentation

## 2011-03-31 DIAGNOSIS — Z9049 Acquired absence of other specified parts of digestive tract: Secondary | ICD-10-CM | POA: Insufficient documentation

## 2011-03-31 DIAGNOSIS — J209 Acute bronchitis, unspecified: Secondary | ICD-10-CM | POA: Insufficient documentation

## 2011-03-31 DIAGNOSIS — K5080 Crohn's disease of both small and large intestine without complications: Secondary | ICD-10-CM | POA: Insufficient documentation

## 2011-03-31 DIAGNOSIS — Z9889 Other specified postprocedural states: Secondary | ICD-10-CM

## 2011-03-31 MED ORDER — AZITHROMYCIN 1 G PO PACK: 1.0000 | PACK | Freq: Once | ORAL | Status: AC

## 2011-03-31 NOTE — Progress Notes (Signed)
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 The patient does have some low-grade chronic diarrhea which is improved with Entocort 9 mg a day. Otherwise she is doing well except for current pulmonary symptomatology consisting of coughing, congestion, low-grade fever, and excessive mucus production. She is a heavy smoker, but denies use in the last week. 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 She apparently did not receive postoperative chemotherapy, and did not see an oncologist. Recent labs show normal CRP and sed rate. The patient denies abdominal pain, nausea vomiting, rectal bleeding, or other systemic complaints.  Current Medications, Allergies, Past Medical History, Past Surgical History, Family History and Social History were reviewed in [employer] record.  Pertinent Review of Systems Negative   Physical Exam: Awake and alert no acute distress. I cannot appreciate stigmata of chronic liver disease, lymphadenopathy, or thyromegaly. Her chest is generally clear to percussion and auscultation. She appears to be in a regular rhythm without murmurs gallops or rubs. Abdominal exam shows no organomegaly, masses, tenderness, or abnormal bowel sounds. Peripheral extremities unremarkable, and mental status is normal.    Assessment and Plan: Crohn's disease of her ileocolonic anastomosis confirmed by recent colon biopsies. Surprisingly, this patient has little GI symptomatology. I decreased her Entocort to 6 mg a day, we'll treat her pulmonary symptoms with a Z-Pak, and have arranged oncology evaluation by Dr. Mancel Bale. Recent CBC and metabolic profiles were normal. Patient will return in 4-6 weeks' time for followup. She does need serum B12 level  checked. Encounter Diagnosis  Name Primary?  . Adenocarcinoma of small intestine Yes

## 2011-03-31 NOTE — Patient Instructions (Signed)
Decrease Entocort to 2 tabs once a day.  Your prescription(s) have been sent to you pharmacy, [facility]. Dr Kalman Drape office or out office will contact you about an appt with him.  Make an appt to come back and see Dr Jarold Motto in 6 weeks.

## 2011-04-04 ENCOUNTER — Telehealth: Payer: Self-pay | Admitting: Oncology

## 2011-04-04 NOTE — Telephone Encounter (Signed)
S/w the pt and she is aware of her new pt consult on 05/17/2011@1 :30pm

## 2011-05-13 ENCOUNTER — Telehealth: Payer: Self-pay | Admitting: *Deleted

## 2011-05-17 ENCOUNTER — Ambulatory Visit: Payer: BC Managed Care – PPO | Admitting: Oncology

## 2011-05-17 ENCOUNTER — Ambulatory Visit: Payer: BC Managed Care – PPO

## 2011-05-17 ENCOUNTER — Other Ambulatory Visit: Payer: BC Managed Care – PPO

## 2011-05-26 ENCOUNTER — Telehealth: Payer: Self-pay | Admitting: Oncology

## 2011-05-26 NOTE — Telephone Encounter (Signed)
lmonvm for pt to call me re r/s'd 2/15 appt (NP) due to [facility] will be out of office.

## 2011-06-04 ENCOUNTER — Telehealth: Payer: Self-pay | Admitting: Oncology

## 2011-06-04 NOTE — Telephone Encounter (Signed)
Due to [facility] on PAL 2/15 called pt to r/s new pt appt. Per pt request appt cancelled due to the referral was actually made at her request but know she wants to see her gi doc again before she goes through with appt here. Message sent to [facility] re above and copied to gina, tiffany and amy m.

## 2011-06-10 ENCOUNTER — Telehealth: Payer: Self-pay | Admitting: *Deleted

## 2011-06-10 NOTE — Telephone Encounter (Signed)
Per Dr Truett Perna, Dr Jarold Motto is aware that patient wishes to follow up with him first before seeing [place] at [facility]. Dr Truett Perna states patient may call back and schedule New Pt appt as needed. Chart held in HIM.

## 2011-06-14 ENCOUNTER — Telehealth: Payer: Self-pay | Admitting: Gastroenterology

## 2011-06-14 ENCOUNTER — Telehealth: Payer: Self-pay | Admitting: Oncology

## 2011-06-14 NOTE — Telephone Encounter (Signed)
Per message from [facility] touched based w/jesse @ [facility] today to inform them that pt has cx'd appt w/BS. Verdon Cummins will inform dr Jarold Motto.

## 2011-06-15 NOTE — Telephone Encounter (Signed)
OV NEEDED 

## 2011-06-15 NOTE — Telephone Encounter (Signed)
Ov scheduled for 06-21-2011

## 2011-06-17 ENCOUNTER — Other Ambulatory Visit: Payer: BC Managed Care – PPO | Admitting: Lab

## 2011-06-17 ENCOUNTER — Ambulatory Visit: Payer: BC Managed Care – PPO

## 2011-06-17 ENCOUNTER — Ambulatory Visit: Payer: BC Managed Care – PPO | Admitting: Oncology

## 2011-06-20 ENCOUNTER — Encounter: Payer: Self-pay | Admitting: *Deleted

## 2011-06-21 ENCOUNTER — Ambulatory Visit (INDEPENDENT_AMBULATORY_CARE_PROVIDER_SITE_OTHER): Payer: BC Managed Care – PPO | Admitting: Gastroenterology

## 2011-06-21 ENCOUNTER — Other Ambulatory Visit (INDEPENDENT_AMBULATORY_CARE_PROVIDER_SITE_OTHER): Payer: BC Managed Care – PPO

## 2011-06-21 ENCOUNTER — Encounter: Payer: Self-pay | Admitting: Gastroenterology

## 2011-06-21 VITALS — BP 94/64 | HR 80 | Ht 62.0 in | Wt 104.8 lb

## 2011-06-21 DIAGNOSIS — K5000 Crohn's disease of small intestine without complications: Secondary | ICD-10-CM

## 2011-06-21 DIAGNOSIS — Z9049 Acquired absence of other specified parts of digestive tract: Secondary | ICD-10-CM

## 2011-06-21 DIAGNOSIS — K5090 Crohn's disease, unspecified, without complications: Secondary | ICD-10-CM

## 2011-06-21 DIAGNOSIS — F17200 Nicotine dependence, unspecified, uncomplicated: Secondary | ICD-10-CM

## 2011-06-21 DIAGNOSIS — K529 Noninfective gastroenteritis and colitis, unspecified: Secondary | ICD-10-CM

## 2011-06-21 DIAGNOSIS — R42 Dizziness and giddiness: Secondary | ICD-10-CM

## 2011-06-21 DIAGNOSIS — K5289 Other specified noninfective gastroenteritis and colitis: Secondary | ICD-10-CM

## 2011-06-21 DIAGNOSIS — Z9889 Other specified postprocedural states: Secondary | ICD-10-CM

## 2011-06-21 LAB — SEDIMENTATION RATE: Sed Rate: 20 mm/hr (ref 0–22)

## 2011-06-21 LAB — CBC WITH DIFFERENTIAL/PLATELET
Basophils Relative: 0.6 % (ref 0.0–3.0)
Eosinophils Relative: 0.6 % (ref 0.0–5.0)
HCT: 43.4 % (ref 36.0–46.0)
Lymphs Abs: 2.7 10*3/uL (ref 0.7–4.0)
MCV: 93.8 fl (ref 78.0–100.0)
Monocytes Absolute: 0.5 10*3/uL (ref 0.1–1.0)
Monocytes Relative: 5.2 % (ref 3.0–12.0)
RBC: 4.63 Mil/uL (ref 3.87–5.11)
WBC: 9.6 10*3/uL (ref 4.5–10.5)

## 2011-06-21 LAB — PROTIME-INR
INR: 0.9 ratio (ref 0.8–1.0)
Prothrombin Time: 10.3 s (ref 10.2–12.4)

## 2011-06-21 MED ORDER — BUDESONIDE 3 MG PO CP24: ORAL_CAPSULE | ORAL | Status: DC

## 2011-06-21 NOTE — Progress Notes (Signed)
This is a 46-year-old Caucasian female status post ileal resection for Crohn's disease with associated abscess several years ago. Review of her pathology from [facility] does not show evidence of small bowel adenocarcinoma. She was recently at anastomotic Crohn's disease confirmed by colonoscopy and CT enteroscopy. He is currently asymptomatic on Entocort 9 mg a day. Her only complaint is" lightheadedness" without any other neurologic symptomatology. Her appetite is good her weight is stable. She denies abuse of alcohol but does smoke heavily. She is on multivitamin replacement therapy, when necessary Xanax, parenteral B12, and multivitamins.  Current Medications, Allergies, Past Medical History, Past Surgical History, Family History and Social History were reviewed in [employer] record.  Pertinent Review of Systems Negative   Physical Exam: Awake and alert in no acute distress. Neurological exam is entirely normal including visual fields and cerebellar functioning. I cannot appreciate stigmata of chronic liver disease. Chest is clear she is in a regular rhythm without murmurs gallops or rubs. There is no organomegaly, abdominal masses, tenderness, distention, or abnormal bowel sounds. Mental status is normal peripheral extremities are unremarkable.    Assessment and Plan: Crohn's disease in remission with Entocort therapy. Her nonspecific neurological complaints do not warrant CT scan of the head at this time. I ordered lab data and inflammatory markers and several vitamin levels. I have urged her to continue multivitamins, B12 injections, Caltrate 600 with vitamin D twice a day, and to decrease her Entocort to 6 mg a day with office followup in one month's time. The patient has no interest in smoking cessation. Encounter Diagnosis  Name Primary?  . Colitis Yes

## 2011-06-21 NOTE — Patient Instructions (Addendum)
You have been given a separate informational sheet regarding your tobacco use, the importance of quitting and local resources to help you quit. Decrease Entocort for one tablet by mouth once a day, Rx has been sent to your pharmacy.  Buy Caltrate 600 with Vit D and take one tablet by mouth twice a day.  Please go to the basement today for your labs.  Make office visit to come back and see Dr Jarold Motto in one month.

## 2011-06-22 LAB — IBC PANEL
Iron: 52 ug/dL (ref 42–145)
Saturation Ratios: 15.6 % — ABNORMAL LOW (ref 20.0–50.0)
Transferrin: 238.3 mg/dL (ref 212.0–360.0)

## 2011-06-22 LAB — HEPATIC FUNCTION PANEL
Albumin: 3.8 g/dL (ref 3.5–5.2)
Total Bilirubin: 0.3 mg/dL (ref 0.3–1.2)

## 2011-06-22 LAB — TSH: TSH: 2 u[IU]/mL (ref 0.35–5.50)

## 2011-06-22 LAB — BASIC METABOLIC PANEL
BUN: 14 mg/dL (ref 6–23)
Calcium: 9.6 mg/dL (ref 8.4–10.5)
Creatinine, Ser: 0.8 mg/dL (ref 0.4–1.2)
GFR: 80.44 mL/min (ref >60.00)

## 2011-06-22 LAB — FERRITIN: Ferritin: 33.2 ng/mL (ref 10.0–291.0)

## 2011-06-22 LAB — C-REACTIVE PROTEIN: CRP: 0.23 mg/dL (ref <0.60)

## 2011-06-22 LAB — MAGNESIUM: Magnesium: 1.8 mg/dL (ref 1.5–2.5)

## 2011-06-22 LAB — FOLATE: Folate: 18.1 ng/mL (ref >5.9)

## 2011-06-23 LAB — CELIAC PANEL 10
Tissue Transglut Ab: 17.8 U/mL (ref <20)
Tissue Transglutaminase Ab, IgA: 5 U/mL (ref <20)

## 2011-06-23 LAB — ZINC: Zinc: 57 ug/dL — ABNORMAL LOW (ref 60–130)

## 2011-06-25 LAB — VITAMIN B1: Vitamin B1 (Thiamine): 16 nmol/L (ref 8–30)

## 2011-07-26 ENCOUNTER — Ambulatory Visit: Payer: BC Managed Care – PPO | Admitting: Gastroenterology

## 2011-08-08 ENCOUNTER — Encounter: Payer: Self-pay | Admitting: *Deleted

## 2011-08-12 ENCOUNTER — Ambulatory Visit: Payer: BC Managed Care – PPO | Admitting: Gastroenterology

## 2011-09-26 ENCOUNTER — Other Ambulatory Visit: Payer: Self-pay | Admitting: Gastroenterology

## 2011-09-27 ENCOUNTER — Ambulatory Visit: Payer: BC Managed Care – PPO | Admitting: Gastroenterology

## 2011-12-07 NOTE — Telephone Encounter (Signed)
No note

## 2013-08-16 ENCOUNTER — Encounter (INDEPENDENT_AMBULATORY_CARE_PROVIDER_SITE_OTHER): Payer: Self-pay | Admitting: Surgery

## 2013-09-02 ENCOUNTER — Ambulatory Visit (INDEPENDENT_AMBULATORY_CARE_PROVIDER_SITE_OTHER): Payer: BC Managed Care – PPO | Admitting: Surgery

## 2013-09-02 ENCOUNTER — Encounter (INDEPENDENT_AMBULATORY_CARE_PROVIDER_SITE_OTHER): Payer: Self-pay | Admitting: Surgery

## 2013-09-02 VITALS — BP 104/72 | HR 78 | Temp 97.6°F | Resp 12 | Ht 62.0 in | Wt 102.8 lb

## 2013-09-02 DIAGNOSIS — Z9889 Other specified postprocedural states: Secondary | ICD-10-CM

## 2013-09-02 DIAGNOSIS — M2420 Disorder of ligament, unspecified site: Secondary | ICD-10-CM

## 2013-09-02 DIAGNOSIS — M629 Disorder of muscle, unspecified: Secondary | ICD-10-CM

## 2013-09-02 DIAGNOSIS — Z9049 Acquired absence of other specified parts of digestive tract: Secondary | ICD-10-CM

## 2013-09-02 DIAGNOSIS — K5010 Crohn's disease of large intestine without complications: Secondary | ICD-10-CM

## 2013-09-02 DIAGNOSIS — R198 Other specified symptoms and signs involving the digestive system and abdomen: Secondary | ICD-10-CM

## 2013-09-02 NOTE — Progress Notes (Addendum)
Patient ID: Jodi Gill, female   DOB: 1966/05/02, 48 y.o.   MRN: [id number]  Chief Complaint  Patient presents with  . New Evaluation    val poss hernia abd wall    HPI Jodi Gill is a 48 y.o. female.  Referred by Dr. Benjie Karvonen for possible ventral hernia GI - Dr. Verl Blalock  HPI This is a 48 yo female with an extensive past medical and surgical history (for which we have limited records).  In 2009, she underwent exploratory laparotomy at [facility] and had a small bowel resection. Apparently this revealed adenocarcinoma of the small bowel. She was also diagnosed with Crohn's disease. She had a complicated course and had to have further surgery at [facility] in 2010. She is not clear what they did at the second surgery. Over the last couple of years, her Crohn's disease has been well managed by Dr. Sharlett Iles and she is asymptomatic. Her last CT scan was in 2012.  We have no access to the operative reports from [place] or [place]. We will try to obtain these for further review.  The patient states that over the last couple of years, she has noticed some asymmetry and laxity to the left abdominal wall. She describes this area as feeling "numb" with no tenderness. She has normal sensation on the right side of her abdominal wall. She is now referred for evaluation for possible ventral hernia.   Past Medical History  Diagnosis Date  . Anxiety   . 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 Kidney stones   . Ovarian cyst   . Low back pain   . Hiatal hernia   . Gastritis   . Crohn's colitis 2012  . Anemia   . Partial small bowel obstruction   . Esophageal reflux   . Esophageal stricture   . Family history of malignant neoplasm of gastrointestinal tract     Past Surgical History  Procedure Laterality Date  . Hemicolectomy  8-09    Rt  . Laparoscopic small bowel resection    . Lithotripsy    . Tubal ligation    . Novasure ablation Right 10/2007 and 06/2008    Family History   Problem Relation Age of Onset  . Colon cancer Father 80    deceased  . Hypertension Mother   . Stomach cancer Father   . Colon cancer Paternal Uncle     Social History History  Substance Use Topics  . Smoking status: Current Every Day Smoker -- 1.00 packs/day for 10 years    Types: Cigarettes  . Smokeless tobacco: Never Used  . Alcohol Use: 0.0 oz/week    4-5 drink(s) per week     Comment: Occasional    Allergies  Allergen Reactions  . Morphine And Related Hives  . Peanut Oil     Throat closes    Current Outpatient Prescriptions  Medication Sig Dispense Refill  . ALPRAZolam (XANAX) 0.5 MG tablet Take 0.5 mg by mouth at bedtime as needed.        . Ascorbic Acid (VITAMIN C) 500 MG CAPS Take by mouth. Once daily      . cyanocobalamin (,VITAMIN B-12,) 1000 MCG/ML injection Inject 1,000 mcg into the muscle once. IM once a month       . esomeprazole (NEXIUM) 40 MG capsule Take one capsule by mouth 30 minutes before breakfast daily.  30 capsule  3  . Multiple Vitamin (MULTI-VITAMIN DAILY PO) Take 1 tablet by mouth daily.        Marland Kitchen  PROAIR HFA 108 (90 BASE) MCG/ACT inhaler        No current facility-administered medications for this visit.    Review of Systems Review of Systems  Constitutional: Negative for fever, chills and unexpected weight change.  HENT: Negative for congestion, hearing loss, sore throat, trouble swallowing and voice change.   Eyes: Negative for visual disturbance.  Respiratory: Negative for cough and wheezing.   Cardiovascular: Negative for chest pain, palpitations and leg swelling.  Gastrointestinal: Positive for abdominal pain. Negative for nausea, vomiting, diarrhea, constipation, blood in stool, abdominal distention and anal bleeding.  Genitourinary: Negative for hematuria, vaginal bleeding and difficulty urinating.  Musculoskeletal: Negative for arthralgias.  Skin: Negative for rash and wound.  Neurological: Negative for seizures, syncope and  headaches.  Hematological: Negative for adenopathy. Does not bruise/bleed easily.  Psychiatric/Behavioral: Negative for confusion.    Blood pressure 104/72, pulse 78, temperature 97.6 F (36.4 C), temperature source Temporal, resp. rate 12, height 5\' 2"  (1.575 m), weight 102 lb 12.8 oz (46.63 kg).  Physical Exam Physical Exam WDWN in NAD HEENT:  EOMI, sclera anicteric Neck:  No masses, no thyromegaly Lungs:  CTA bilaterally; normal respiratory effort CV:  Regular rate and rhythm; no murmurs Abd:  +bowel sounds, soft, non-tender, healed midline scar with some laxity to the left abdominal wall; no obvious fascial defect, but this area does enlarge with Valsalva maneuver Ext:  Well-perfused; no edema Skin:  Warm, dry; no sign of jaundice  Data Reviewed none  Assessment    Complex past surgical history  Crohns' colitis Hx of small bowel adenocarcinoma     Plan    We need to obtain and review her previous medical and surgical records before determining a treatment plan.  She may have had some laxity from denervation of the left abdominal wall muscles, or she may have an enlarging ventral hernia.  After we have reviewed her records, I will determine whether she needs a CT scan to further evaluate this left abdominal wall.          Jodi Gill. Cloyd Ragas 09/02/2013, 11:16 AM    Addendum:   We have obtained records from [facility] from 06/25/08 - 07/03/08.  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  In July 2009, she underwent exploratory laparotomy and right hemicolectomy for an indurated mass that had perforated into the right pelvic sidewall.  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  She had significant wound issues and had two separated percutaneous drainage procedures.  She described stool and pus draining from the flank wound.  Colonoscopy in January 2010 showed a fistula near the  anastomosis.    She was admitted to the hospital for PICC line and TNA.  On 06/30/08, she underwent takedown of the EC fistula, which had arisen from the old ileocolonic anastomosis to the iliopsoas muscle below the right kidney.  The muscle was packed with omentum.  She had resection of the old ileocolonic anastomosis with a new ileo-transverse colon anastomosis.  She also had resection and anastomosis of an adjacent loop of small bowel that had been involved in the fistula.  She was discharged on 07/03/08 on a regular diet.    Colonoscopy by Dr. Verl Blalock on 02/28/11 showed ulcerated mucosa and thickening of the small bowel at the anastomosis.  We will obtain a CT scan to evaluated the abdominal wall, then we will see her back for a follow-up visit to  plan possible surgery.  Jodi Gill. Georgette Dover, MD, [facility]  General/ Trauma Surgery  09/03/2013 5:48 PM

## 2013-09-03 NOTE — Addendum Note (Signed)
Addended by: Maia Petties on: 09/03/2013 05:59 PM   Modules accepted: Orders

## 2013-09-04 ENCOUNTER — Telehealth (INDEPENDENT_AMBULATORY_CARE_PROVIDER_SITE_OTHER): Payer: Self-pay | Admitting: General Surgery

## 2013-09-04 NOTE — Telephone Encounter (Signed)
LMOM for patient to call back and ask for Jodi Gill I need to schedule her for an CT and get lab work done and see Dr Georgette Dover after the test is done

## 2013-09-12 ENCOUNTER — Ambulatory Visit
Admission: RE | Admit: 2013-09-12 | Discharge: 2013-09-12 | Disposition: A | Discharge status: Home / Self Care | Payer: BC Managed Care – PPO | Source: Ambulatory Visit | Attending: Surgery | Admitting: Surgery

## 2013-09-12 DIAGNOSIS — K5010 Crohn's disease of large intestine without complications: Secondary | ICD-10-CM

## 2013-09-12 MED ORDER — IOHEXOL 300 MG/ML  SOLN
75.0000 mL | Freq: Once | INTRAMUSCULAR | Status: AC | PRN
  Administered 2013-09-12: 75 mL via INTRAVENOUS

## 2013-09-16 ENCOUNTER — Telehealth (INDEPENDENT_AMBULATORY_CARE_PROVIDER_SITE_OTHER): Payer: Self-pay | Admitting: General Surgery

## 2013-09-16 NOTE — Telephone Encounter (Signed)
LMOM for patient to let her know that Dr Georgette Dover will have be the one who will need to go over her CT Scan, I told her that she will need to keep apt with Dr Georgette Dover on 09-30-2013

## 2013-09-30 ENCOUNTER — Encounter (INDEPENDENT_AMBULATORY_CARE_PROVIDER_SITE_OTHER): Payer: Self-pay | Admitting: Surgery

## 2013-09-30 ENCOUNTER — Ambulatory Visit (INDEPENDENT_AMBULATORY_CARE_PROVIDER_SITE_OTHER): Payer: BC Managed Care – PPO | Admitting: Surgery

## 2013-09-30 VITALS — BP 122/68 | HR 78 | Temp 98.0°F | Resp 12 | Ht 62.0 in | Wt 101.4 lb

## 2013-09-30 DIAGNOSIS — M629 Disorder of muscle, unspecified: Secondary | ICD-10-CM

## 2013-09-30 DIAGNOSIS — R198 Other specified symptoms and signs involving the digestive system and abdomen: Secondary | ICD-10-CM

## 2013-09-30 NOTE — Progress Notes (Signed)
The patient returns for discussion of her left abdominal wall laxity. She had a recent CT scan that showed no sign of hernia. She does have some muscle asymmetry with some laxity on the left but no obvious hernia. We reviewed her records from [facility]. She did have an initial exploratory laparotomy for a small bowel cancer. She had complications and had a partial right hemicolectomy. She had some significant wound complications.  The patient is shown her CT scan. She is glad to hear that she does not need surgery. I told her that if the laxity becomes worse and she has an obvious bulge in this area then she should come back for another visit. At this time I cannot recommend any type of surgical repair.  [name]. Georgette Dover, MD, [facility]  General/ Trauma Surgery  09/30/2013 9:32 AM

## 2013-10-28 ENCOUNTER — Encounter (INDEPENDENT_AMBULATORY_CARE_PROVIDER_SITE_OTHER): Payer: Self-pay

## 2013-11-05 ENCOUNTER — Encounter (INDEPENDENT_AMBULATORY_CARE_PROVIDER_SITE_OTHER): Payer: Self-pay
